# Patient Record
Sex: Female | Born: 1960 | Race: Black or African American | Hispanic: No | Marital: Single | Smoking: Never smoker
Health system: Southern US, Community
[De-identification: ages and names within clinical notes are randomized; demographics above are authoritative.]

## PROBLEM LIST (undated history)

## (undated) DIAGNOSIS — I10 Essential (primary) hypertension: Secondary | ICD-10-CM

---

## 2014-04-04 ENCOUNTER — Emergency Department (HOSPITAL_BASED_OUTPATIENT_CLINIC_OR_DEPARTMENT_OTHER): Payer: Self-pay

## 2014-04-04 ENCOUNTER — Emergency Department (HOSPITAL_BASED_OUTPATIENT_CLINIC_OR_DEPARTMENT_OTHER)
Admission: EM | Admit: 2014-04-04 | Discharge: 2014-04-04 | Disposition: A | Discharge status: Home / Self Care | Payer: Self-pay | Attending: Emergency Medicine | Admitting: Emergency Medicine

## 2014-04-04 ENCOUNTER — Encounter (HOSPITAL_BASED_OUTPATIENT_CLINIC_OR_DEPARTMENT_OTHER): Payer: Self-pay | Admitting: Emergency Medicine

## 2014-04-04 DIAGNOSIS — I10 Essential (primary) hypertension: Secondary | ICD-10-CM | POA: Insufficient documentation

## 2014-04-04 DIAGNOSIS — M25511 Pain in right shoulder: Secondary | ICD-10-CM

## 2014-04-04 DIAGNOSIS — M25519 Pain in unspecified shoulder: Secondary | ICD-10-CM | POA: Insufficient documentation

## 2014-04-04 HISTORY — DX: Essential (primary) hypertension: I10

## 2014-04-04 MED ORDER — IBUPROFEN 800 MG PO TABS
800.0000 mg | ORAL_TABLET | Freq: Once | ORAL | Status: AC
  Administered 2014-04-04: 800 mg via ORAL
  Filled 2014-04-04: qty 1

## 2014-04-04 NOTE — ED Notes (Signed)
Patient asked to change into a drape.

## 2014-04-04 NOTE — ED Provider Notes (Signed)
CSN: [id number]     Arrival date & time 04/04/14  [id number] History   First MD Initiated Contact with Patient 04/04/14 1003     Chief Complaint  Patient presents with  . Shoulder Pain     (Consider location/radiation/quality/duration/timing/severity/associated sxs/prior Treatment) HPI 53 year-old female presents complaining of right shoulder pain. She states she has had pain for several days. She works [employer] and does some heavy lifting and repetitive movements at the shoulder. She's had no definite injury. She describes the pain as on the lateral aspect of the shoulder. It worsens with trying to move her shoulder up above her head and to externally rotate her shoulder. She has not had any similar symptoms in the past. She has taken Tylenol without relief. There is no redness or swelling.  Past Medical History  Diagnosis Date  . Hypertension    History reviewed. No pertinent past surgical history. No family history on file. History  Substance Use Topics  . Smoking status: Never Smoker   . Smokeless tobacco: Not on file  . Alcohol Use: Yes     Comment: 6 pk beer on weekends   OB History   Grav Para Term Preterm Abortions TAB SAB Ect Mult Living                 Review of Systems  All other systems reviewed and are negative.     Allergies  Review of patient's allergies indicates no known allergies.  Home Medications   Prior to Admission medications   Not on File   BP 188/108  Pulse 78  Temp(Src) 98.2 F (36.8 C) (Oral)  Resp 18  Ht 5\' 11"  (1.803 m)  Wt 250 lb (113.399 kg)  BMI 34.88 kg/m2  SpO2 100%  LMP 03/21/2014 Physical Exam  Nursing note and vitals reviewed. Constitutional: She is oriented to person, place, and time. She appears well-developed and well-nourished.  HENT:  Head: Normocephalic and atraumatic.  Right Ear: External ear normal.  Left Ear: External ear normal.  Nose: Nose normal.  Mouth/Throat: Oropharynx is clear and moist.  Eyes: Conjunctivae  and EOM are normal. Pupils are equal, round, and reactive to light.  Neck: Normal range of motion. Neck supple. No JVD present. No tracheal deviation present. No thyromegaly present.  Cardiovascular: Normal rate, regular rhythm, normal heart sounds and intact distal pulses.   Pulmonary/Chest: Effort normal and breath sounds normal. No respiratory distress. She has no wheezes.  Abdominal: Soft. Bowel sounds are normal. She exhibits no mass. There is no tenderness. There is no guarding.  Musculoskeletal:  Right shoulder appears visually normal. She has some tenderness to palpation over the lateral aspect of the right shoulder. There is decreased range of movement on a abduction and external rotation. She has full active range of motion of the elbow and wrist. The sensation is intact throughout the right upper extremity with pulses 2+. Clavicle is nontender neck appears normal  Lymphadenopathy:    She has no cervical adenopathy.  Neurological: She is alert and oriented to person, place, and time. She has normal reflexes. No cranial nerve deficit or sensory deficit. Gait normal. GCS eye subscore is 4. GCS verbal subscore is 5. GCS motor subscore is 6.  Reflex Scores:      Bicep reflexes are 2+ on the right side and 2+ on the left side.      Patellar reflexes are 2+ on the right side and 2+ on the left side. Strength is 5/5 bilateral elbow flexor/extensors,  wrist extension/flexion, intrinsic hand strength equal Bilateral hip flexion/extension 5/5, knee flexion/extension 5/5, ankle 5/5 flexion extension    Skin: Skin is warm and dry.  Psychiatric: She has a normal mood and affect. Her behavior is normal. Judgment and thought content normal.    ED Course  Procedures (including critical care time) Labs Review Labs Reviewed - No data to display  Imaging Review No results found.   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 I have discussed treatment the patient with nonsteroidal anti-inflammatory drugs and range of motion exercises. She is given a note for work for no lifting greater than 2 pounds and decreased use of right shoulder. She is given a referral to Dr. Pearletha ForgeHudnall for followup. I have discussed the patient's hypertension with her and she understands that she should be seeing her primary care Dr. this week for recheck and restart of medicine to   Hilario Quarryanielle S Adriyana Greenbaum, MD 04/04/14 1017

## 2014-04-04 NOTE — Discharge Instructions (Signed)
Shoulder Pain The shoulder is the joint that connects your arm to your body. Muscles and band-like tissues that connect bones to muscles (tendons) hold the joint together. Shoulder pain is felt if an injury or medical problem affects one or more parts of the shoulder. HOME CARE   Put ice on the sore area.  Put ice in a plastic bag.  Place a towel between your skin and the bag.  Leave the ice on for 15-20 minutes, 03-04 times a day for the first 2 days.  Stop using cold packs if they do not help with the pain.  If you were given something to keep your shoulder from moving (sling, shoulder immobilizer), wear it as told. Only take it off to shower or bathe.  Move your arm as little as possible, but keep your hand moving to prevent puffiness (swelling).  Squeeze a soft ball or foam pad as much as possible to help prevent swelling.  Take medicine as told by your doctor. GET HELP RIGHT AWAY IF:   Your arm, hand, or fingers are numb or tingling.  Your arm, hand, or fingers are puffy (swollen), painful, or turn white or blue.  You have more pain.  You have progressing new pain in your arm, hand, or fingers.  Your hand or fingers get cold.  Your medicine does not help lessen your pain. MAKE SURE YOU:   Understand these instructions.  Will watch your condition.  Will get help right away if you are not doing well or get worse. Document Released: 05/12/2008 Document Revised: 08/18/2012 Document Reviewed: 06/07/2012 [employer] Patient Information 2014 [employer]  

## 2014-04-04 NOTE — ED Notes (Signed)
Right shoulder pain that started yesterday while at work.  Denies injury.

## 2014-04-04 NOTE — ED Notes (Signed)
Patient transported to X-ray 

## 2014-04-07 ENCOUNTER — Ambulatory Visit: Payer: Self-pay | Admitting: Family Medicine

## 2015-04-18 IMAGING — CR DG SHOULDER 2+V*R*
3 series · 3 of 3 positions shown · non-contrast
Comparison: None.

CLINICAL DATA: Right shoulder pain

EXAM:
RIGHT SHOULDER - 2+ VIEW

[w shoulder ap internal righ]
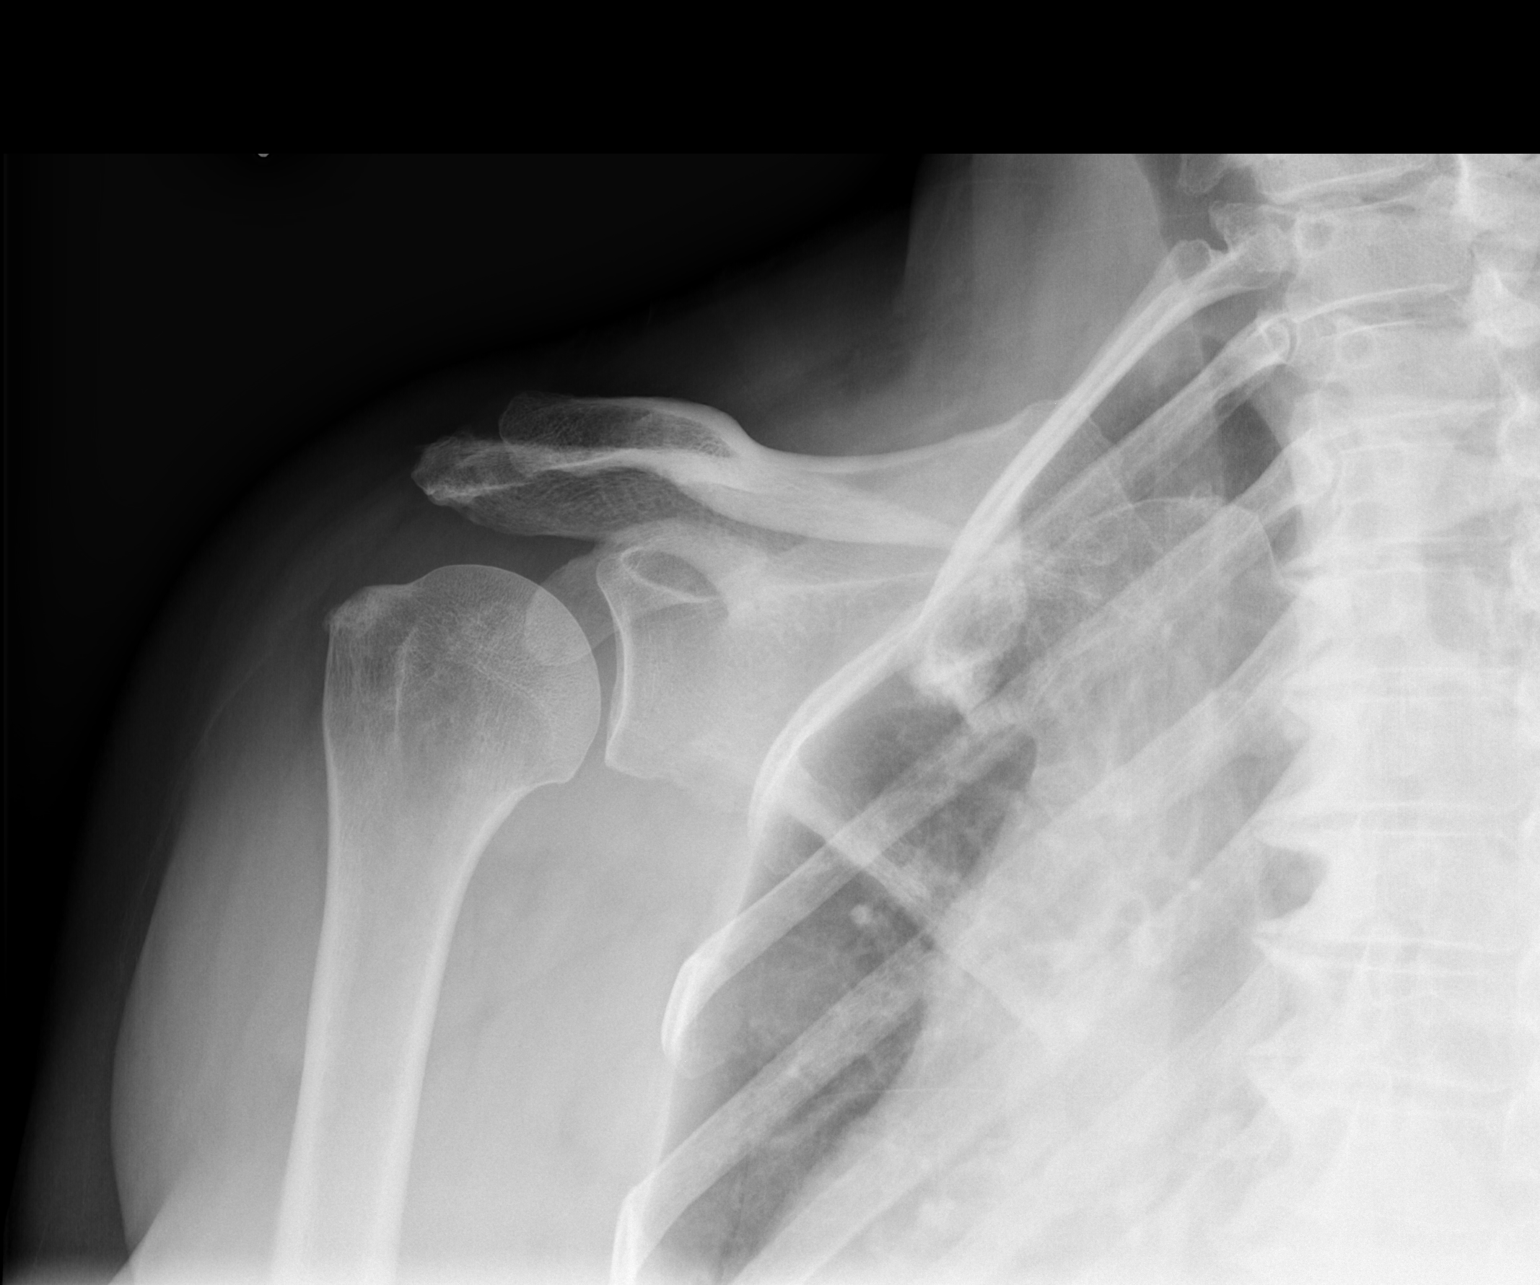

[w shoulder y view right]
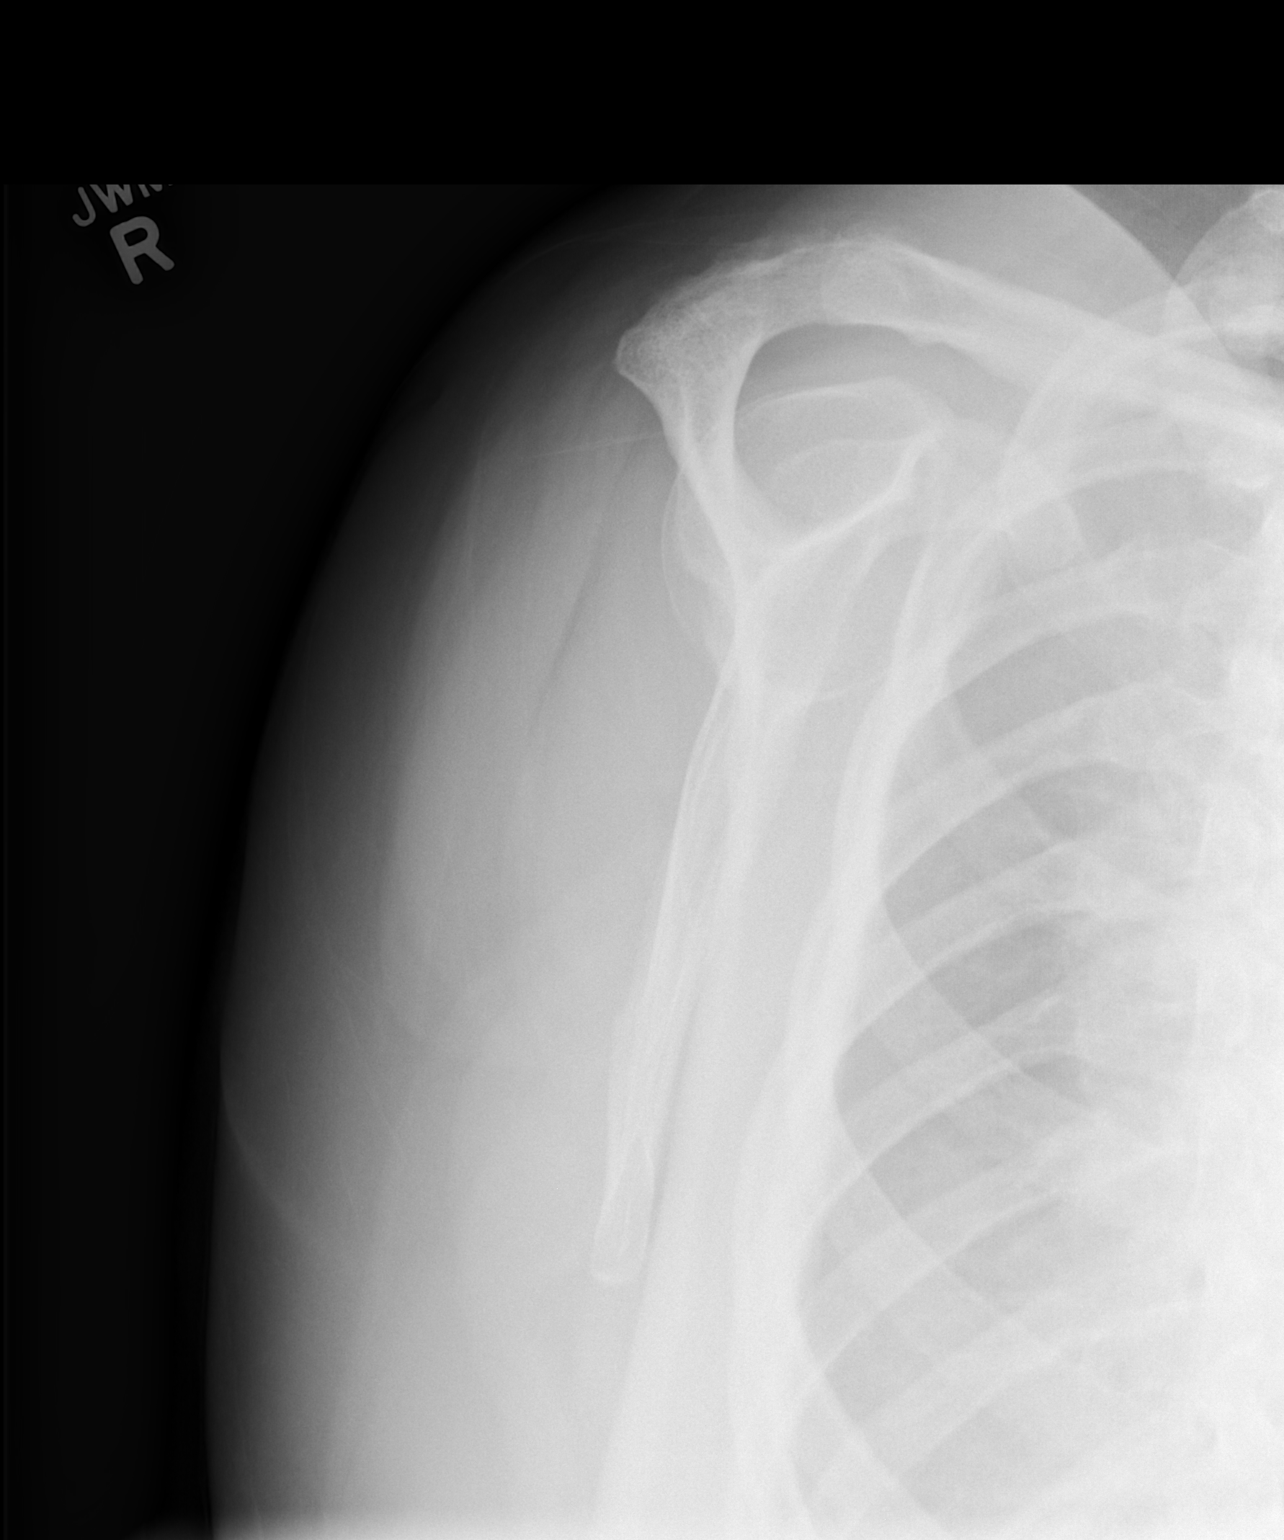

[x shoulder axillary right]
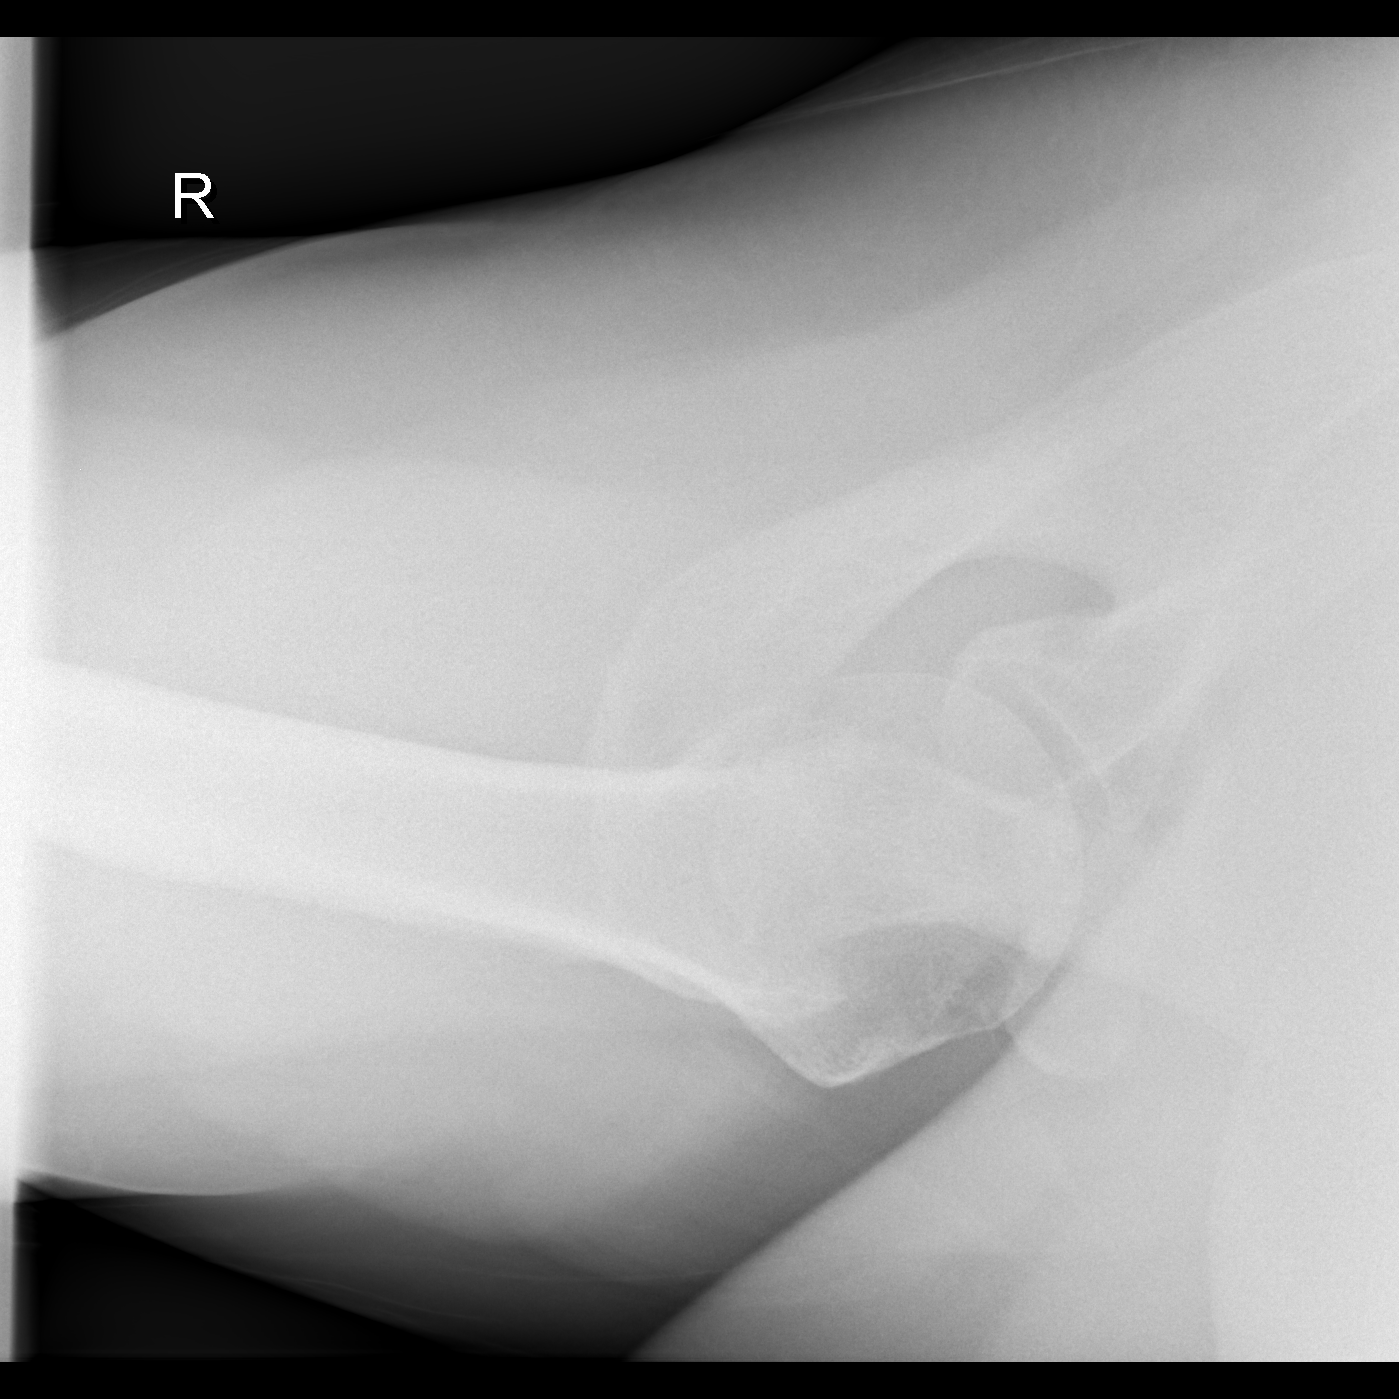

[3 of 3 positions shown; findings below may reference images not displayed]

FINDINGS: Glenohumeral joint is intact. No evidence of scapular fracture or
humeral fracture. The acromioclavicular joint is intact.
IMPRESSION: No acute osseous abnormality.
